# Patient Record
Sex: Female | Born: 1965 | Race: White | Hispanic: No | Marital: Married | Smoking: Never smoker
Health system: Southern US, Community
[De-identification: ages and names within clinical notes are randomized; demographics above are authoritative.]

## PROBLEM LIST (undated history)

## (undated) DIAGNOSIS — N920 Excessive and frequent menstruation with regular cycle: Secondary | ICD-10-CM

---

## 1999-04-01 ENCOUNTER — Inpatient Hospital Stay (HOSPITAL_COMMUNITY): Admission: AD | Admit: 1999-04-01 | Discharge: 1999-04-04 | Payer: Self-pay | Admitting: Obstetrics and Gynecology

## 1999-08-27 ENCOUNTER — Encounter: Admission: RE | Admit: 1999-08-27 | Discharge: 1999-08-27 | Payer: Self-pay | Admitting: Obstetrics and Gynecology

## 1999-08-27 ENCOUNTER — Encounter: Payer: Self-pay | Admitting: Obstetrics and Gynecology

## 2000-07-24 ENCOUNTER — Other Ambulatory Visit: Admission: RE | Admit: 2000-07-24 | Discharge: 2000-07-24 | Payer: Self-pay | Admitting: Obstetrics and Gynecology

## 2001-09-19 ENCOUNTER — Other Ambulatory Visit: Admission: RE | Admit: 2001-09-19 | Discharge: 2001-09-19 | Payer: Self-pay | Admitting: Obstetrics and Gynecology

## 2002-10-16 ENCOUNTER — Other Ambulatory Visit: Admission: RE | Admit: 2002-10-16 | Discharge: 2002-10-16 | Payer: Self-pay | Admitting: Obstetrics and Gynecology

## 2004-01-26 ENCOUNTER — Other Ambulatory Visit: Admission: RE | Admit: 2004-01-26 | Discharge: 2004-01-26 | Payer: Self-pay | Admitting: Obstetrics and Gynecology

## 2004-10-19 ENCOUNTER — Other Ambulatory Visit: Admission: RE | Admit: 2004-10-19 | Discharge: 2004-10-19 | Payer: Self-pay | Admitting: Obstetrics and Gynecology

## 2004-11-25 ENCOUNTER — Ambulatory Visit (HOSPITAL_COMMUNITY): Admission: RE | Admit: 2004-11-25 | Discharge: 2004-11-25 | Payer: Self-pay | Admitting: Obstetrics and Gynecology

## 2005-02-22 ENCOUNTER — Ambulatory Visit (HOSPITAL_COMMUNITY): Admission: RE | Admit: 2005-02-22 | Discharge: 2005-02-22 | Payer: Self-pay | Admitting: Obstetrics and Gynecology

## 2005-05-04 ENCOUNTER — Inpatient Hospital Stay (HOSPITAL_COMMUNITY): Admission: RE | Admit: 2005-05-04 | Discharge: 2005-05-07 | Payer: Self-pay | Admitting: Obstetrics and Gynecology

## 2005-05-12 ENCOUNTER — Encounter: Admission: RE | Admit: 2005-05-12 | Discharge: 2005-06-11 | Payer: Self-pay | Admitting: Obstetrics and Gynecology

## 2005-06-06 ENCOUNTER — Other Ambulatory Visit: Admission: RE | Admit: 2005-06-06 | Discharge: 2005-06-06 | Payer: Self-pay | Admitting: Obstetrics and Gynecology

## 2005-06-12 ENCOUNTER — Encounter: Admission: RE | Admit: 2005-06-12 | Discharge: 2005-06-23 | Payer: Self-pay | Admitting: Obstetrics and Gynecology

## 2010-10-07 ENCOUNTER — Other Ambulatory Visit: Payer: Self-pay | Admitting: Obstetrics and Gynecology

## 2010-10-07 DIAGNOSIS — R928 Other abnormal and inconclusive findings on diagnostic imaging of breast: Secondary | ICD-10-CM

## 2010-10-13 ENCOUNTER — Ambulatory Visit
Admission: RE | Admit: 2010-10-13 | Discharge: 2010-10-13 | Disposition: A | Discharge status: Home / Self Care | Payer: BC Managed Care – PPO | Source: Ambulatory Visit | Attending: Obstetrics and Gynecology | Admitting: Obstetrics and Gynecology

## 2010-10-13 DIAGNOSIS — R928 Other abnormal and inconclusive findings on diagnostic imaging of breast: Secondary | ICD-10-CM

## 2012-06-14 ENCOUNTER — Other Ambulatory Visit (HOSPITAL_COMMUNITY): Payer: Self-pay | Admitting: Obstetrics and Gynecology

## 2012-06-14 DIAGNOSIS — R011 Cardiac murmur, unspecified: Secondary | ICD-10-CM

## 2012-06-19 ENCOUNTER — Other Ambulatory Visit (HOSPITAL_COMMUNITY): Payer: Self-pay | Admitting: Cardiovascular Disease

## 2012-06-19 ENCOUNTER — Ambulatory Visit (HOSPITAL_COMMUNITY): Payer: BC Managed Care – PPO

## 2012-06-19 DIAGNOSIS — R0989 Other specified symptoms and signs involving the circulatory and respiratory systems: Secondary | ICD-10-CM

## 2012-06-20 ENCOUNTER — Ambulatory Visit (HOSPITAL_COMMUNITY): Payer: BC Managed Care – PPO

## 2012-06-25 ENCOUNTER — Ambulatory Visit (HOSPITAL_COMMUNITY)
Admission: RE | Admit: 2012-06-25 | Discharge: 2012-06-25 | Disposition: A | Discharge status: Home / Self Care | Payer: BC Managed Care – PPO | Source: Ambulatory Visit | Attending: Cardiovascular Disease | Admitting: Cardiovascular Disease

## 2012-06-25 DIAGNOSIS — R0989 Other specified symptoms and signs involving the circulatory and respiratory systems: Secondary | ICD-10-CM | POA: Insufficient documentation

## 2012-06-25 NOTE — Progress Notes (Signed)
Carotid Duplex Complete Zelda Reames 

## 2013-07-24 ENCOUNTER — Ambulatory Visit (INDEPENDENT_AMBULATORY_CARE_PROVIDER_SITE_OTHER): Payer: BC Managed Care – PPO | Admitting: Internal Medicine

## 2013-07-24 ENCOUNTER — Encounter: Payer: Self-pay | Admitting: Internal Medicine

## 2013-07-24 VITALS — BP 128/78 | HR 72 | Temp 98.2°F | Resp 18 | Wt 146.0 lb

## 2013-07-24 DIAGNOSIS — Z7189 Other specified counseling: Secondary | ICD-10-CM

## 2013-07-24 DIAGNOSIS — N6019 Diffuse cystic mastopathy of unspecified breast: Secondary | ICD-10-CM

## 2013-07-24 DIAGNOSIS — Z630 Problems in relationship with spouse or partner: Secondary | ICD-10-CM

## 2013-07-24 LAB — COMPREHENSIVE METABOLIC PANEL
ALBUMIN: 4.7 g/dL (ref 3.5–5.2)
ALT: 10 U/L (ref 0–35)
AST: 15 U/L (ref 0–37)
Alkaline Phosphatase: 39 U/L (ref 39–117)
BUN: 11 mg/dL (ref 6–23)
CALCIUM: 9.7 mg/dL (ref 8.4–10.5)
CHLORIDE: 102 meq/L (ref 96–112)
CO2: 27 meq/L (ref 19–32)
Creat: 0.87 mg/dL (ref 0.50–1.10)
GLUCOSE: 88 mg/dL (ref 70–99)
Potassium: 4.4 mEq/L (ref 3.5–5.3)
Sodium: 137 mEq/L (ref 135–145)
Total Bilirubin: 0.9 mg/dL (ref 0.2–1.2)
Total Protein: 7.3 g/dL (ref 6.0–8.3)

## 2013-07-24 LAB — LIPID PANEL
Cholesterol: 190 mg/dL (ref 0–200)
HDL: 69 mg/dL (ref >39)
LDL CALC: 107 mg/dL — AB (ref 0–99)
Total CHOL/HDL Ratio: 2.8 Ratio
Triglycerides: 68 mg/dL (ref <150)
VLDL: 14 mg/dL (ref 0–40)

## 2013-07-24 LAB — CBC WITH DIFFERENTIAL/PLATELET
Basophils Absolute: 0.1 10*3/uL (ref 0.0–0.1)
Basophils Relative: 1 % (ref 0–1)
Eosinophils Absolute: 0.1 10*3/uL (ref 0.0–0.7)
Eosinophils Relative: 1 % (ref 0–5)
HCT: 38 % (ref 36.0–46.0)
Hemoglobin: 13.2 g/dL (ref 12.0–15.0)
LYMPHS ABS: 1.2 10*3/uL (ref 0.7–4.0)
LYMPHS PCT: 22 % (ref 12–46)
MCH: 31.9 pg (ref 26.0–34.0)
MCHC: 34.7 g/dL (ref 30.0–36.0)
MCV: 91.8 fL (ref 78.0–100.0)
MONO ABS: 0.4 10*3/uL (ref 0.1–1.0)
Monocytes Relative: 8 % (ref 3–12)
NEUTROS ABS: 3.7 10*3/uL (ref 1.7–7.7)
Neutrophils Relative %: 68 % (ref 43–77)
Platelets: 273 10*3/uL (ref 150–400)
RBC: 4.14 MIL/uL (ref 3.87–5.11)
RDW: 13.6 % (ref 11.5–15.5)
WBC: 5.4 10*3/uL (ref 4.0–10.5)

## 2013-07-24 LAB — TSH: TSH: 0.935 u[IU]/mL (ref 0.350–4.500)

## 2013-07-24 NOTE — Patient Instructions (Signed)
Give pt number to [facility]  Schedule CPE  To laba todday

## 2013-07-24 NOTE — Progress Notes (Signed)
   Subjective:    Patient ID: Lauren York, female    DOB: 07/11/1965, 48 y.o.   MRN: [id number]  HPI Lauren York is a new pt first visit for primary care  PMH fibrocystic breast disease otherwise healthy.  She has a Mirena IUD    Marital stress now.  Hus[facility] had stem cell [facility] for [facility].  She uses an occasional Xanax.    No Known Allergies History reviewed. No pertinent past medical history. History reviewed. No pertinent past surgical history. History   Social History  . Marital Status: Married    Spouse Name: N/A    Number of Children: N/A  . Years of Education: N/A   Occupational History  . Not on file.   Social History Main Topics  . Smoking status: Never Smoker   . Smokeless tobacco: Never Used  . Alcohol Use: Not on file     Comment: weekly  . Drug Use: No  . Sexual Activity: Yes    Birth Control/ Protection: IUD   Other Topics Concern  . Not on file   Social History Narrative  . No narrative on file   Family History  Problem Relation Age of Onset  . Atrial fibrillation Mother   . Heart disease Father   . Cancer Father     prostate   Patient Active Problem List   Diagnosis Date Noted  . Fibrocystic breast 07/24/2013   No current outpatient prescriptions on file prior to visit.   No current facility-administered medications on file prior to visit.      Review of Systems See HPI    Objective:   Physical Exam Physical Exam  Nursing note and vitals reviewed.  Pt.  Teary during interview Constitutional: She is oriented to person, place, and time. She appears well-developed and well-nourished.  HENT:  Head: Normocephalic and atraumatic.  Cardiovascular: Normal rate and regular rhythm. Exam reveals no gallop and no friction rub.  No murmur heard.  Pulmonary/Chest: Breath sounds normal. She has no wheezes. She has no rales.  Neurological: She is alert and oriented to person, place, and time.  Skin: Skin is warm and dry.  Psychiatric: She  has a normal mood and affect. Her behavior is normal.        Assessment & Plan:  Fibrocystic breast :  [facility]  Marital strife    Will check all labs to day  Schedule CPE  I spent 45 mints with this pt

## 2013-07-25 LAB — VITAMIN D 25 HYDROXY (VIT D DEFICIENCY, FRACTURES): Vit D, 25-Hydroxy: 41 ng/mL (ref 30–89)

## 2013-07-29 ENCOUNTER — Encounter: Payer: Self-pay | Admitting: *Deleted

## 2014-03-10 ENCOUNTER — Encounter: Payer: Self-pay | Admitting: Internal Medicine

## 2014-06-05 ENCOUNTER — Other Ambulatory Visit: Payer: Self-pay | Admitting: Hematology & Oncology

## 2014-06-05 DIAGNOSIS — J208 Acute bronchitis due to other specified organisms: Secondary | ICD-10-CM

## 2014-06-05 MED ORDER — OSELTAMIVIR PHOSPHATE 75 MG PO CAPS: 75.0000 mg | ORAL_CAPSULE | Freq: Two times a day (BID) | ORAL | Status: DC

## 2014-11-27 ENCOUNTER — Encounter: Payer: Self-pay | Admitting: *Deleted

## 2014-12-30 ENCOUNTER — Encounter: Payer: Self-pay | Admitting: Cardiovascular Disease

## 2015-01-14 ENCOUNTER — Encounter (HOSPITAL_BASED_OUTPATIENT_CLINIC_OR_DEPARTMENT_OTHER): Payer: Self-pay | Admitting: *Deleted

## 2015-01-14 NOTE — Progress Notes (Signed)
NPO AFTER MN.  ARRIVE AT 0600.  NEEDS HG AND URINE PREG.  

## 2015-01-17 NOTE — H&P (Signed)
Lauren York is an 49 y.o. female with heavy menstrual bleeding despite Nexplanon.  Patient declines continued hormonal treatment.   Pertinent Gynecological History: Menses: regular every month without intermenstrual spotting Bleeding: dysfunctional uterine bleeding Contraception: Nexplanon DES exposure: unknown Blood transfusions: none Sexually transmitted diseases: no past history Previous GYN Procedures: C/S x 2  Last mammogram: normal Date: 2016 Last pap: normal Date: 2016 OB History: G2, P2  Menstrual History: Menarche age: n/a Patient's last menstrual period was 01/03/2015 (approximate).    Past Medical History  Diagnosis Date  . Heavy menstrual bleeding     Past Surgical History  Procedure Laterality Date  . Cesarean section  2000  &  2006    Family History  Problem Relation Age of Onset  . Atrial fibrillation Mother   . Heart disease Father   . Cancer Father     prostate  . Heart disease Maternal Grandmother     Social History:  reports that she has never smoked. She has never used smokeless tobacco. She reports that she does not use illicit drugs. Her alcohol history is not on file.  Allergies: No Known Allergies  No prescriptions prior to admission    ROS  Height  (1.753 m), weight 140 lb (63.504 kg), last menstrual period 01/03/2015. Physical Exam  Constitutional: She is oriented to person, place, and time. She appears well-developed and well-nourished.  GI: Soft. There is no rebound and no guarding.  Neurological: She is alert and oriented to person, place, and time.  Skin: Skin is warm and dry.  Psychiatric: She has a normal mood and affect. Her behavior is normal.    No results found for this or any previous visit (from the past 24 hour(s)).  No results found.  Assessment/Plan: 49yo G2P2 with heavy menstrual bleeding -Laparoscopic BTL, H/S D&C, Novasure, Nexplanon removal  Lauren York 01/17/2015, 9:57 PM

## 2015-01-20 ENCOUNTER — Encounter (HOSPITAL_BASED_OUTPATIENT_CLINIC_OR_DEPARTMENT_OTHER): Payer: Self-pay | Admitting: *Deleted

## 2015-01-20 ENCOUNTER — Ambulatory Visit (HOSPITAL_BASED_OUTPATIENT_CLINIC_OR_DEPARTMENT_OTHER): Payer: BLUE CROSS/BLUE SHIELD | Admitting: Certified Registered"

## 2015-01-20 ENCOUNTER — Ambulatory Visit (HOSPITAL_BASED_OUTPATIENT_CLINIC_OR_DEPARTMENT_OTHER)
Admission: RE | Admit: 2015-01-20 | Discharge: 2015-01-20 | Disposition: A | Discharge status: Home / Self Care | Payer: BLUE CROSS/BLUE SHIELD | Source: Ambulatory Visit | Attending: Obstetrics & Gynecology | Admitting: Obstetrics & Gynecology

## 2015-01-20 ENCOUNTER — Encounter (HOSPITAL_BASED_OUTPATIENT_CLINIC_OR_DEPARTMENT_OTHER)
Admission: RE | Disposition: A | Discharge status: Home / Self Care | Payer: Self-pay | Source: Ambulatory Visit | Attending: Obstetrics & Gynecology

## 2015-01-20 DIAGNOSIS — N920 Excessive and frequent menstruation with regular cycle: Secondary | ICD-10-CM | POA: Diagnosis not present

## 2015-01-20 DIAGNOSIS — Z302 Encounter for sterilization: Secondary | ICD-10-CM | POA: Diagnosis not present

## 2015-01-20 DIAGNOSIS — Z3049 Encounter for surveillance of other contraceptives: Secondary | ICD-10-CM | POA: Diagnosis not present

## 2015-01-20 DIAGNOSIS — N840 Polyp of corpus uteri: Secondary | ICD-10-CM | POA: Diagnosis not present

## 2015-01-20 DIAGNOSIS — N6019 Diffuse cystic mastopathy of unspecified breast: Secondary | ICD-10-CM

## 2015-01-20 HISTORY — PX: LAPAROSCOPIC TUBAL LIGATION: SHX1937

## 2015-01-20 HISTORY — DX: Excessive and frequent menstruation with regular cycle: N92.0

## 2015-01-20 HISTORY — PX: REMOVAL OF DRUG DELIVERY IMPLANT: SHX6585

## 2015-01-20 HISTORY — PX: DILITATION & CURRETTAGE/HYSTROSCOPY WITH NOVASURE ABLATION: SHX5568

## 2015-01-20 LAB — POCT PREGNANCY, URINE: PREG TEST UR: NEGATIVE

## 2015-01-20 LAB — POCT HEMOGLOBIN-HEMACUE: HEMOGLOBIN: 12.8 g/dL (ref 12.0–15.0)

## 2015-01-20 SURGERY — LIGATION, FALLOPIAN TUBE, LAPAROSCOPIC
Anesthesia: General | Site: Vagina

## 2015-01-20 MED ORDER — ROCURONIUM BROMIDE 100 MG/10ML IV SOLN
INTRAVENOUS | Status: DC | PRN
  Administered 2015-01-20: 40 mg via INTRAVENOUS

## 2015-01-20 MED ORDER — ACETAMINOPHEN 10 MG/ML IV SOLN
INTRAVENOUS | Status: DC | PRN
  Administered 2015-01-20: 1000 mg via INTRAVENOUS

## 2015-01-20 MED ORDER — GLYCOPYRROLATE 0.2 MG/ML IJ SOLN
INTRAMUSCULAR | Status: DC | PRN
  Administered 2015-01-20: 0.6 mg via INTRAVENOUS

## 2015-01-20 MED ORDER — LACTATED RINGERS IV SOLN
INTRAVENOUS | Status: DC
  Administered 2015-01-20 (×2): via INTRAVENOUS
  Filled 2015-01-20: qty 1000

## 2015-01-20 MED ORDER — MIDAZOLAM HCL 5 MG/5ML IJ SOLN
INTRAMUSCULAR | Status: DC | PRN
  Administered 2015-01-20 (×2): 1 mg via INTRAVENOUS

## 2015-01-20 MED ORDER — FENTANYL CITRATE (PF) 100 MCG/2ML IJ SOLN
INTRAMUSCULAR | Status: AC
  Filled 2015-01-20: qty 6

## 2015-01-20 MED ORDER — ONDANSETRON HCL 4 MG/2ML IJ SOLN
INTRAMUSCULAR | Status: DC | PRN
Start: 2015-01-20 — End: 2015-01-20
  Administered 2015-01-20: 4 mg via INTRAVENOUS

## 2015-01-20 MED ORDER — PROPOFOL 10 MG/ML IV BOLUS
INTRAVENOUS | Status: DC | PRN
  Administered 2015-01-20: 200 mg via INTRAVENOUS

## 2015-01-20 MED ORDER — LIDOCAINE HCL (CARDIAC) 20 MG/ML IV SOLN
INTRAVENOUS | Status: DC | PRN
  Administered 2015-01-20: 60 mg via INTRAVENOUS

## 2015-01-20 MED ORDER — SODIUM CHLORIDE 0.9 % IR SOLN
Status: DC | PRN
  Administered 2015-01-20: 3000 mL

## 2015-01-20 MED ORDER — DEXAMETHASONE SODIUM PHOSPHATE 4 MG/ML IJ SOLN
INTRAMUSCULAR | Status: DC | PRN
  Administered 2015-01-20: 10 mg via INTRAVENOUS

## 2015-01-20 MED ORDER — CEFAZOLIN SODIUM-DEXTROSE 2-3 GM-% IV SOLR
INTRAVENOUS | Status: AC
  Filled 2015-01-20: qty 50

## 2015-01-20 MED ORDER — CEFAZOLIN SODIUM-DEXTROSE 2-3 GM-% IV SOLR
2.0000 g | INTRAVENOUS | Status: AC
  Administered 2015-01-20: 2 g via INTRAVENOUS
  Filled 2015-01-20: qty 50

## 2015-01-20 MED ORDER — IBUPROFEN 800 MG PO TABS
800.0000 mg | ORAL_TABLET | Freq: Four times a day (QID) | ORAL | Status: DC | PRN
Start: 2015-01-20 — End: 2020-09-08

## 2015-01-20 MED ORDER — LACTATED RINGERS IV SOLN
INTRAVENOUS | Status: DC
  Filled 2015-01-20: qty 1000

## 2015-01-20 MED ORDER — OXYCODONE HCL 5 MG PO TABS
5.0000 mg | ORAL_TABLET | Freq: Once | ORAL | Status: AC
  Administered 2015-01-20: 5 mg via ORAL
  Filled 2015-01-20: qty 1

## 2015-01-20 MED ORDER — OXYCODONE HCL 5 MG PO TABS
ORAL_TABLET | ORAL | Status: AC
  Filled 2015-01-20: qty 1

## 2015-01-20 MED ORDER — PROMETHAZINE HCL 25 MG/ML IJ SOLN
6.2500 mg | INTRAMUSCULAR | Status: DC | PRN
  Filled 2015-01-20: qty 1

## 2015-01-20 MED ORDER — FENTANYL CITRATE (PF) 100 MCG/2ML IJ SOLN
INTRAMUSCULAR | Status: DC | PRN
  Administered 2015-01-20: 25 ug via INTRAVENOUS
  Administered 2015-01-20: 50 ug via INTRAVENOUS
  Administered 2015-01-20 (×3): 25 ug via INTRAVENOUS
  Administered 2015-01-20: 50 ug via INTRAVENOUS
  Administered 2015-01-20 (×4): 25 ug via INTRAVENOUS

## 2015-01-20 MED ORDER — OXYCODONE-ACETAMINOPHEN 5-325 MG PO TABS: 1.0000 | ORAL_TABLET | ORAL | Status: DC | PRN

## 2015-01-20 MED ORDER — NEOSTIGMINE METHYLSULFATE 10 MG/10ML IV SOLN
INTRAVENOUS | Status: DC | PRN
  Administered 2015-01-20: 4 mg via INTRAVENOUS

## 2015-01-20 MED ORDER — FENTANYL CITRATE (PF) 100 MCG/2ML IJ SOLN
INTRAMUSCULAR | Status: AC
  Filled 2015-01-20: qty 2

## 2015-01-20 MED ORDER — MEPERIDINE HCL 25 MG/ML IJ SOLN
6.2500 mg | INTRAMUSCULAR | Status: DC | PRN
  Filled 2015-01-20: qty 1

## 2015-01-20 MED ORDER — LACTATED RINGERS IV SOLN
INTRAVENOUS | Status: DC
  Administered 2015-01-20: 06:00:00 via INTRAVENOUS
  Filled 2015-01-20: qty 1000

## 2015-01-20 MED ORDER — BUPIVACAINE HCL (PF) 0.25 % IJ SOLN
INTRAMUSCULAR | Status: DC | PRN
  Administered 2015-01-20: 5 mL

## 2015-01-20 MED ORDER — MIDAZOLAM HCL 2 MG/2ML IJ SOLN
INTRAMUSCULAR | Status: AC
  Filled 2015-01-20: qty 2

## 2015-01-20 MED ORDER — KETOROLAC TROMETHAMINE 30 MG/ML IJ SOLN
INTRAMUSCULAR | Status: DC | PRN
  Administered 2015-01-20: 30 mg via INTRAVENOUS

## 2015-01-20 MED ORDER — FENTANYL CITRATE (PF) 100 MCG/2ML IJ SOLN
25.0000 ug | INTRAMUSCULAR | Status: DC | PRN
  Administered 2015-01-20 (×3): 25 ug via INTRAVENOUS
  Filled 2015-01-20: qty 1

## 2015-01-20 SURGICAL SUPPLY — 66 items
ABLATOR ENDOMETRIAL BIPOLAR (ABLATOR) ×1 IMPLANT
APL SKNCLS STERI-STRIP NONHPOA (GAUZE/BANDAGES/DRESSINGS)
APPLICATOR COTTON TIP 6IN STRL (MISCELLANEOUS) ×4 IMPLANT
BAG SPEC RTRVL LRG 6X4 10 (ENDOMECHANICALS)
BANDAGE ADH SHEER 1  50/CT (GAUZE/BANDAGES/DRESSINGS) ×1 IMPLANT
BENZOIN TINCTURE PRP APPL 2/3 (GAUZE/BANDAGES/DRESSINGS) IMPLANT
BLADE SURG 11 STRL SS (BLADE) ×4 IMPLANT
BLADE SURG 15 STRL LF DISP TIS (BLADE) IMPLANT
BLADE SURG 15 STRL SS (BLADE) ×4
CANISTER SUCTION 2500CC (MISCELLANEOUS) ×4 IMPLANT
CATH ROBINSON RED A/P 16FR (CATHETERS) ×4 IMPLANT
CHLORAPREP W/TINT 26ML (MISCELLANEOUS) ×4 IMPLANT
CLIP FILSHIE TUBAL LIGA STRL (Clip) ×3 IMPLANT
COVER BACK TABLE 60X90IN (DRAPES) ×8 IMPLANT
COVER MAYO STAND STRL (DRAPES) ×4 IMPLANT
DRAPE LG THREE QUARTER DISP (DRAPES) ×4 IMPLANT
DRAPE UNDERBUTTOCKS STRL (DRAPE) ×4 IMPLANT
DRSG TEGADERM 2-3/8X2-3/4 SM (GAUZE/BANDAGES/DRESSINGS) ×1 IMPLANT
DRSG TEGADERM 4X4.75 (GAUZE/BANDAGES/DRESSINGS) ×4 IMPLANT
DRSG TELFA 3X8 NADH (GAUZE/BANDAGES/DRESSINGS) ×4 IMPLANT
ELECT REM PT RETURN 9FT ADLT (ELECTROSURGICAL) ×4
ELECTRODE REM PT RTRN 9FT ADLT (ELECTROSURGICAL) ×3 IMPLANT
GLOVE BIO SURGEON STRL SZ 6 (GLOVE) ×8 IMPLANT
GLOVE BIOGEL PI IND STRL 6 (GLOVE) ×6 IMPLANT
GLOVE BIOGEL PI IND STRL 7.5 (GLOVE) IMPLANT
GLOVE BIOGEL PI INDICATOR 6 (GLOVE) ×2
GLOVE BIOGEL PI INDICATOR 7.5 (GLOVE) ×3
GLOVE SURG SS PI 6.0 STRL IVOR (GLOVE) ×8 IMPLANT
GOWN STRL REUS W/ TWL LRG LVL3 (GOWN DISPOSABLE) ×9 IMPLANT
GOWN STRL REUS W/TWL LRG LVL3 (GOWN DISPOSABLE) ×4
LEGGING LITHOTOMY PAIR STRL (DRAPES) ×4 IMPLANT
LIQUID BAND (GAUZE/BANDAGES/DRESSINGS) IMPLANT
LOOP ANGLED CUTTING 22FR (CUTTING LOOP) IMPLANT
MANIFOLD NEPTUNE II (INSTRUMENTS) IMPLANT
NDL HYPO 25X1 1.5 SAFETY (NEEDLE) ×3 IMPLANT
NDL SPNL 22GX3.5 QUINCKE BK (NEEDLE) IMPLANT
NEEDLE HYPO 25X1 1.5 SAFETY (NEEDLE) ×4 IMPLANT
NEEDLE INSUFFLATION 120MM (ENDOMECHANICALS) ×4 IMPLANT
NEEDLE SPNL 22GX3.5 QUINCKE BK (NEEDLE) IMPLANT
NS IRRIG 500ML POUR BTL (IV SOLUTION) ×4 IMPLANT
PACK BASIN DAY SURGERY FS (CUSTOM PROCEDURE TRAY) ×4 IMPLANT
PAD DRESSING TELFA 3X8 NADH (GAUZE/BANDAGES/DRESSINGS) ×3 IMPLANT
PAD OB MATERNITY 4.3X12.25 (PERSONAL CARE ITEMS) ×4 IMPLANT
PADDING ION DISPOSABLE (MISCELLANEOUS) ×4 IMPLANT
PENCIL BUTTON HOLSTER BLD 10FT (ELECTRODE) IMPLANT
POUCH SPECIMEN RETRIEVAL 10MM (ENDOMECHANICALS) IMPLANT
SCISSORS LAP 5X35 DISP (ENDOMECHANICALS) IMPLANT
SEALER TISSUE G2 CVD JAW 35 (ENDOMECHANICALS) IMPLANT
SEALER TISSUE G2 CVD JAW 45CM (ENDOMECHANICALS) IMPLANT
SET IRRIG TUBING LAPAROSCOPIC (IRRIGATION / IRRIGATOR) IMPLANT
SOLUTION ANTI FOG 6CC (MISCELLANEOUS) ×4 IMPLANT
SPONGE GAUZE 2X2 8PLY STRL LF (GAUZE/BANDAGES/DRESSINGS) ×1 IMPLANT
SPONGE LAP 4X18 X RAY DECT (DISPOSABLE) IMPLANT
STRIP CLOSURE SKIN 1/2X4 (GAUZE/BANDAGES/DRESSINGS) IMPLANT
STRIP CLOSURE SKIN 1/4X4 (GAUZE/BANDAGES/DRESSINGS) IMPLANT
SUT MNCRL AB 3-0 PS2 18 (SUTURE) ×4 IMPLANT
SUT VICRYL 0 UR6 27IN ABS (SUTURE) ×4 IMPLANT
SYR CONTROL 10ML LL (SYRINGE) ×4 IMPLANT
TOWEL OR 17X24 6PK STRL BLUE (TOWEL DISPOSABLE) ×8 IMPLANT
TRAY DSU PREP LF (CUSTOM PROCEDURE TRAY) ×4 IMPLANT
TROCAR BLADELESS OPT 5 100 (ENDOMECHANICALS) ×1 IMPLANT
TROCAR XCEL NON-BLD 11X100MML (ENDOMECHANICALS) ×4 IMPLANT
TROCAR XCEL NON-BLD 5MMX100MML (ENDOMECHANICALS) IMPLANT
TUBING INSUFFLATION 10FT LAP (TUBING) ×4 IMPLANT
WARMER LAPAROSCOPE (MISCELLANEOUS) ×4 IMPLANT
WATER STERILE IRR 500ML POUR (IV SOLUTION) ×4 IMPLANT

## 2015-01-20 NOTE — Progress Notes (Signed)
No change to H&P.  Arlin Sass, DO 

## 2015-01-20 NOTE — Discharge Instructions (Signed)
Call MD for T>100.4, heavy vaginal bleeding, severe abdominal pain, intractable nausea and/or vomiting or respiratory distress.  Call office to schedule postop appointment in 2 weeks.  No driving while taking narcotics.  Pelvic rest x 4 weeks.    HOME CARE INSTRUCTIONS - LAPAROSCOPY  Wound Care: The bandaids or dressing which are placed over the skin openings may be removed the day after surgery. The incision should be kept clean and dry. The stitches do not need to be removed. Should the incision become sore, red, and swollen after the first week, check with your doctor.  Personal Hygiene: Shower the day after your procedure. Always wipe from front to back after elimination.   Activity: Do not drive or operate any equipment today. The effects of the anesthesia are still present and drowsiness may result. Rest today, not necessarily flat bed rest, just take it easy. You may resume your normal activity in one to three days or as instructed by your physician.  Sexual Activity: You resume sexual activity as indicated by your physician_________. If your laparoscopy was for a sterilization ( tubes tied ), continue current method of birth control until after your next period or ask for specific instructions from your doctor.  Diet: Eat a light diet as desired this evening. You may resume a regular diet tomorrow.  Return to Work: Two to three days or as indicated by your doctor.  Expectations After Surgery: Your surgery will cause vaginal drainage or spotting which may continue for 2-3 days. Mild abdominal discomfort or tenderness is not unusual and some shoulder pain may also be noted which can be relieved by lying flat in pain.  Call Your Doctor If these Occur:  Persistent or heavy bleeding at incision site       Redness or swelling around incision       Elevation of temperature greater than 100 degrees F  Call for follow-up appointment _____________.   Post Anesthesia Home Care  Instructions  Activity: Get plenty of rest for the remainder of the day. A responsible adult should stay with you for 24 hours following the procedure.  For the next 24 hours, DO NOT: -Drive a car -Advertising copywriter -Drink alcoholic beverages -Take any medication unless instructed by your physician -Make any legal decisions or sign important papers.  Meals: Start with liquid foods such as gelatin or soup. Progress to regular foods as tolerated. Avoid greasy, spicy, heavy foods. If nausea and/or vomiting occur, drink only clear liquids until the nausea and/or vomiting subsides. Call your physician if vomiting continues.  Special Instructions/Symptoms: Your throat may feel dry or sore from the anesthesia or the breathing tube placed in your throat during surgery. If this causes discomfort, gargle with warm salt water. The discomfort should disappear within 24 hours.  If you had a scopolamine patch placed behind your ear for the management of post- operative nausea and/or vomiting:  1. The medication in the patch is effective for 72 hours, after which it should be removed.  Wrap patch in a tissue and discard in the trash. Wash hands thoroughly with soap and water. 2. You may remove the patch earlier than 72 hours if you experience unpleasant side effects which may include dry mouth, dizziness or visual disturbances. 3. Avoid touching the patch. Wash your hands with soap and water after contact with the patch.

## 2015-01-20 NOTE — Anesthesia Procedure Notes (Signed)
Procedure Name: Intubation Date/Time: 01/20/2015 7:37 AM Performed by: Jessica Priest Pre-anesthesia Checklist: Patient identified, Emergency Drugs available, Suction available and Patient being monitored Patient Re-evaluated:Patient Re-evaluated prior to inductionOxygen Delivery Method: Circle System Utilized Preoxygenation: Pre-oxygenation with 100% oxygen Intubation Type: IV induction Ventilation: Mask ventilation without difficulty Laryngoscope Size: Mac and 3 Grade View: Grade I Tube type: Oral Tube size: 7.0 mm Number of attempts: 1 Airway Equipment and Method: Stylet and Oral airway Placement Confirmation: ETT inserted through vocal cords under direct vision,  positive ETCO2 and breath sounds checked- equal and bilateral Secured at: 23 cm Tube secured with: Tape Dental Injury: Teeth and Oropharynx as per pre-operative assessment

## 2015-01-20 NOTE — Anesthesia Preprocedure Evaluation (Addendum)
Anesthesia Evaluation  Patient identified by MRN, date of birth, ID band Patient awake    Reviewed: Allergy & Precautions, NPO status , Patient's Chart, lab work & pertinent test results  Airway Mallampati: II  TM Distance: >3 FB Neck ROM: Full    Dental no notable dental hx.    Pulmonary neg pulmonary ROS,    Pulmonary exam normal breath sounds clear to auscultation       Cardiovascular negative cardio ROS Normal cardiovascular exam Rhythm:Regular Rate:Normal     Neuro/Psych negative neurological ROS  negative psych ROS   GI/Hepatic negative GI ROS, Neg liver ROS,   Endo/Other  negative endocrine ROS  Renal/GU negative Renal ROS  negative genitourinary   Musculoskeletal negative musculoskeletal ROS (+)   Abdominal   Peds negative pediatric ROS (+)  Hematology negative hematology ROS (+)   Anesthesia Other Findings   Reproductive/Obstetrics negative OB ROS                             Anesthesia Physical Anesthesia Plan  ASA: I  Anesthesia Plan: General   Post-op Pain Management:    Induction: Intravenous  Airway Management Planned: Oral ETT  Additional Equipment:   Intra-op Plan:   Post-operative Plan: Extubation in OR  Informed Consent: I have reviewed the patients History and Physical, chart, labs and discussed the procedure including the risks, benefits and alternatives for the proposed anesthesia with the patient or authorized representative who has indicated his/her understanding and acceptance.   Dental advisory given  Plan Discussed with: CRNA  Anesthesia Plan Comments:         Anesthesia Quick Evaluation  

## 2015-01-20 NOTE — Op Note (Signed)
Luvina B Aveni 01/20/2015  PREOPERATIVE DIAGNOSIS:  Heavy menstrual bleeding, desires sterility  POSTOPERATIVE DIAGNOSIS:  SAA  PROCEDURE:  Laparoscopic BTL, hysteroscopy, D&C, Novasure, removal of Nexplanon  ANESTHESIA:  General endotracheal  COMPLICATIONS:  None immediate.  PATHOLOGY:  Endometrial curettings  ESTIMATED BLOOD LOSS:  Less than 20 ml.  INDICATIONS: 49 y.o.  with heavy menstrual bleeding and desire for permanent sterilization.  Declines continued hormonal tx.    FINDINGS:  Normal retroflexed uterus, normal fallopian tubes and ovaries.  Normal appendix.  Normal internal uterine anatomy; thin endometrium.  TECHNIQUE:  The patient was taken to the operating room where general anesthesia was obtained without difficulty.   The left upper, inner arm was prepped with betadine.  A 3mm skin incision was made at the distal endo of the Nexplanon.  Nexplanon was easily removed and dressing was placed.    She was then placed in the dorsal lithotomy position and prepared and draped in sterile fashion.  After an adequate timeout was performed, the bladder was drained of 125cc clear urine.   A bivalved speculum was then placed in the patient's vagina, and the anterior lip of cervix grasped with the single-tooth tenaculum.  The hulka clip was advanced into the uterus.  The speculum was removed from the vagina.  Attention was then turned to the patient's abdomen where a 10-mm skin incision was made on the umbilical fold.  The Veress needle was carefully introduced into the peritoneal cavity through the abdominal wall.  Intraperitoneal placement was confirmed by drop in intraabdominal pressure with insufflation of carbon dioxide gas.  Adequate pneumoperitoneum was obtained, and the 10/11 XL trocar was then advanced without difficulty into the abdomen where intraabdominal placement was confirmed by the operative laparoscope.  Because of the sharp retroflexion of the uterus, uterine manipulation  alone did not allow access to the fallopian tubes so a second port was needed.  After infiltration with .25% lidocaine, a 5mm suprapubic incision was made.  The 5mm trocar was advanced under direct visualization without difficulty.  The right fallopian tube was elevated and clamped with Filshie clip approximately 3 cm from the cornual region.  The diameter of the tube was greater than average and so a second clip was applied to incorporate the entire tubal diameter.  Hemostasis was noted.  The left fallopian tube was then elevated and a Filshie clip was applied approximately 3 cm from the cornual region.  Hemostasis was noted.  All instruments were removed from the abdomen.  Infraumbilical port fascial incision was closed with 3-0 Vicryl in figure of eight stitch.  Both skin incisions were closed with 3-0 Monocryl in a subcuticular fashion.      The uterine manipulator and the tenaculum were removed from the vagina without complications.   The uteus was sounded to 10 cm and dilated manually with metal dilators to accommodate the 5.5 mm diagnostic hysteroscope.  Once the cervix was dilated, the hysteroscope was inserted under direct visualization using saline as a suspension medium.  The uterine cavity was carefully examined, both ostia were recognized.   After further careful visualization of the uterine cavity, the hysteroscope was removed under direct visualization; measuring cervical length of 4.5 cm on removal of the scope.  A sharp curettage was then performed to obtain a small amount of endometrial curettings.  The Novasure was opened and calibrated according to the previously measured dimensions.  The Novasure was advanced through the cervix and opened; cavity width 3 cm .  The test  cycle was performed and then the ablation.  The cycle lasted 90 seconds at 91 [address].  The Novasure was removed and then the tenaculum was removed from the anterior lip of the cervix and the vaginal speculum was removed after noting  good hemostasis.  The patient tolerated the procedure well and was taken to the recovery area awake, extubated and in stable condition.   The patient tolerated the procedure well.  Sponge, lap, and needle counts were correct times two.  The patient was then taken to the recovery room awake, extubated and in stable  in stable condition.

## 2015-01-20 NOTE — Anesthesia Postprocedure Evaluation (Signed)
  Anesthesia Post-op Note  Patient: Lauren York  Procedure(s) Performed: Procedure(s) (LRB): LAPAROSCOPIC TUBAL LIGATION WITH FISHIE CLIPS (Bilateral) DILATATION & CURETTAGE/HYSTEROSCOPY WITH NOVASURE ABLATION (N/A) REMOVAL OF DRUG DELIVERY IMPLANT (NEXPLANON REMOVAL FROM LEFT ARM) (Left)  Patient Location: PACU  Anesthesia Type: General  Level of Consciousness: awake and alert   Airway and Oxygen Therapy: Patient Spontanous Breathing  Post-op Pain: mild  Post-op Assessment: Post-op Vital signs reviewed, Patient's Cardiovascular Status Stable, Respiratory Function Stable, Patent Airway and No signs of Nausea or vomiting  Last Vitals:  Filed Vitals:   01/20/15 0945  BP: 115/80  Pulse: 53  Temp:   Resp: 12    Post-op Vital Signs: stable   Complications: No apparent anesthesia complications

## 2015-01-20 NOTE — Transfer of Care (Signed)
Immediate Anesthesia Transfer of Care Note  Patient: Lauren York  Procedure(s) Performed: Procedure(s) (LRB): LAPAROSCOPIC TUBAL LIGATION WITH FISHIE CLIPS (Bilateral) DILATATION & CURETTAGE/HYSTEROSCOPY WITH NOVASURE ABLATION (N/A) REMOVAL OF DRUG DELIVERY IMPLANT (NEXPLANON REMOVAL FROM LEFT ARM) (Left)  Patient Location: PACU  Anesthesia Type: General  Level of Consciousness: awake, sedated, patient cooperative and responds to stimulation  Airway & Oxygen Therapy: Patient Spontanous Breathing and Patient connected to face mask oxygen  Post-op Assessment: Report given to PACU RN, Post -op Vital signs reviewed and stable and Patient moving all extremities  Post vital signs: Reviewed and stable  Complications: No apparent anesthesia complications

## 2015-01-21 ENCOUNTER — Encounter (HOSPITAL_BASED_OUTPATIENT_CLINIC_OR_DEPARTMENT_OTHER): Payer: Self-pay | Admitting: Obstetrics & Gynecology

## 2016-03-14 ENCOUNTER — Other Ambulatory Visit: Payer: Self-pay | Admitting: Obstetrics & Gynecology

## 2016-03-14 DIAGNOSIS — R928 Other abnormal and inconclusive findings on diagnostic imaging of breast: Secondary | ICD-10-CM

## 2016-03-17 ENCOUNTER — Ambulatory Visit (INDEPENDENT_AMBULATORY_CARE_PROVIDER_SITE_OTHER): Payer: BLUE CROSS/BLUE SHIELD | Admitting: Physician Assistant

## 2016-03-17 DIAGNOSIS — Z23 Encounter for immunization: Secondary | ICD-10-CM

## 2016-03-22 ENCOUNTER — Ambulatory Visit
Admission: RE | Admit: 2016-03-22 | Discharge: 2016-03-22 | Disposition: A | Discharge status: Home / Self Care | Payer: BLUE CROSS/BLUE SHIELD | Source: Ambulatory Visit | Attending: Obstetrics & Gynecology | Admitting: Obstetrics & Gynecology

## 2016-03-22 DIAGNOSIS — R928 Other abnormal and inconclusive findings on diagnostic imaging of breast: Secondary | ICD-10-CM

## 2018-01-10 IMAGING — MG 2D DIGITAL DIAGNOSTIC UNILATERAL LEFT MAMMOGRAM WITH CAD AND ADJ
6 series · 6 of 14 positions shown · non-contrast
Comparison: 03/10/2016

CLINICAL DATA: Left breast mass detected on recent screening
[facility]

EXAM:
2D DIGITAL DIAGNOSTIC LEFT MAMMOGRAM WITH CAD AND ADJUNCT TOMO
ULTRASOUND LEFT BREAST

[L CC synth-2D]
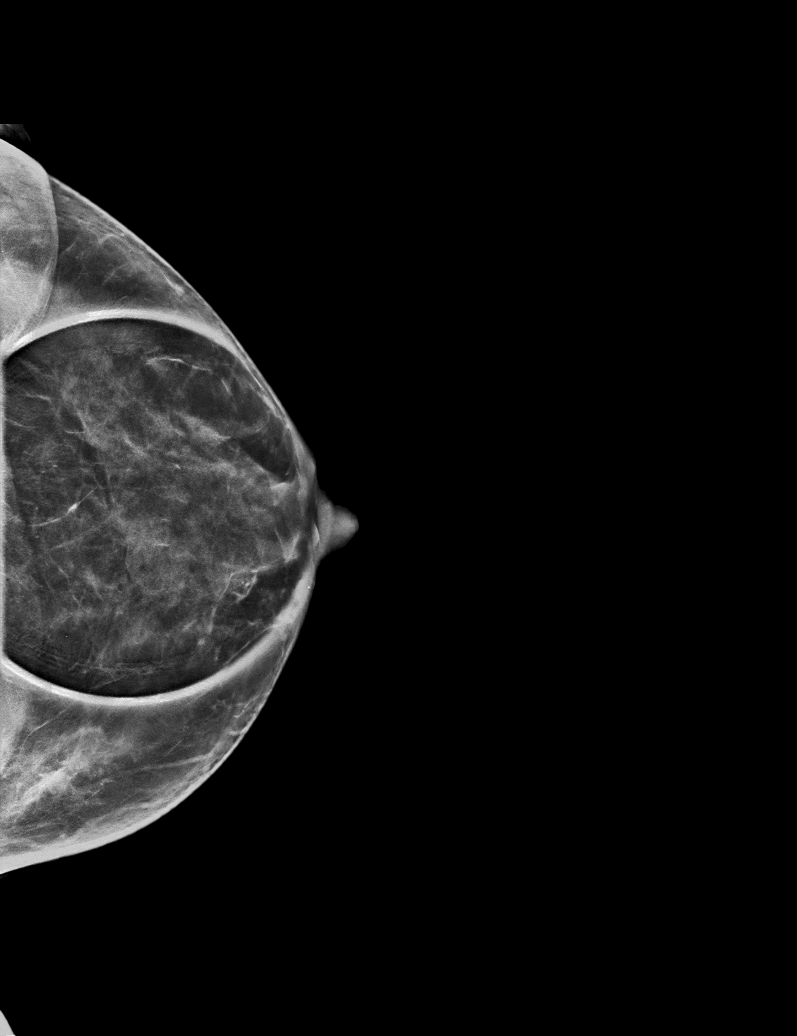

[L MLO]
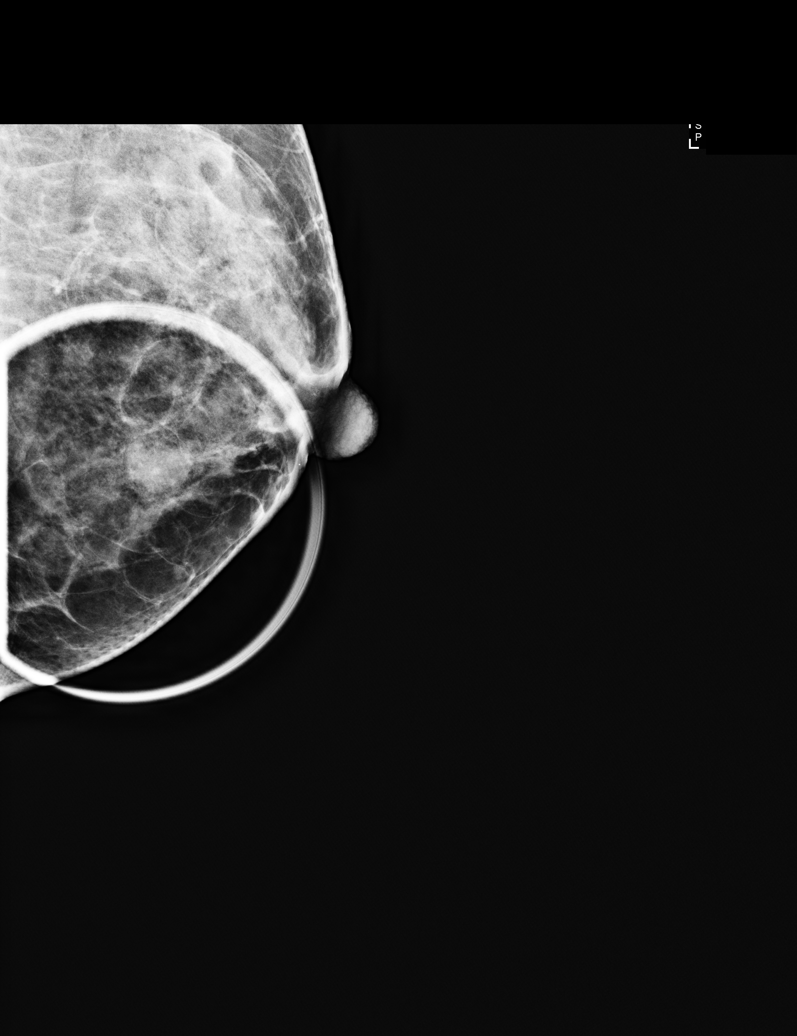

[L CC]
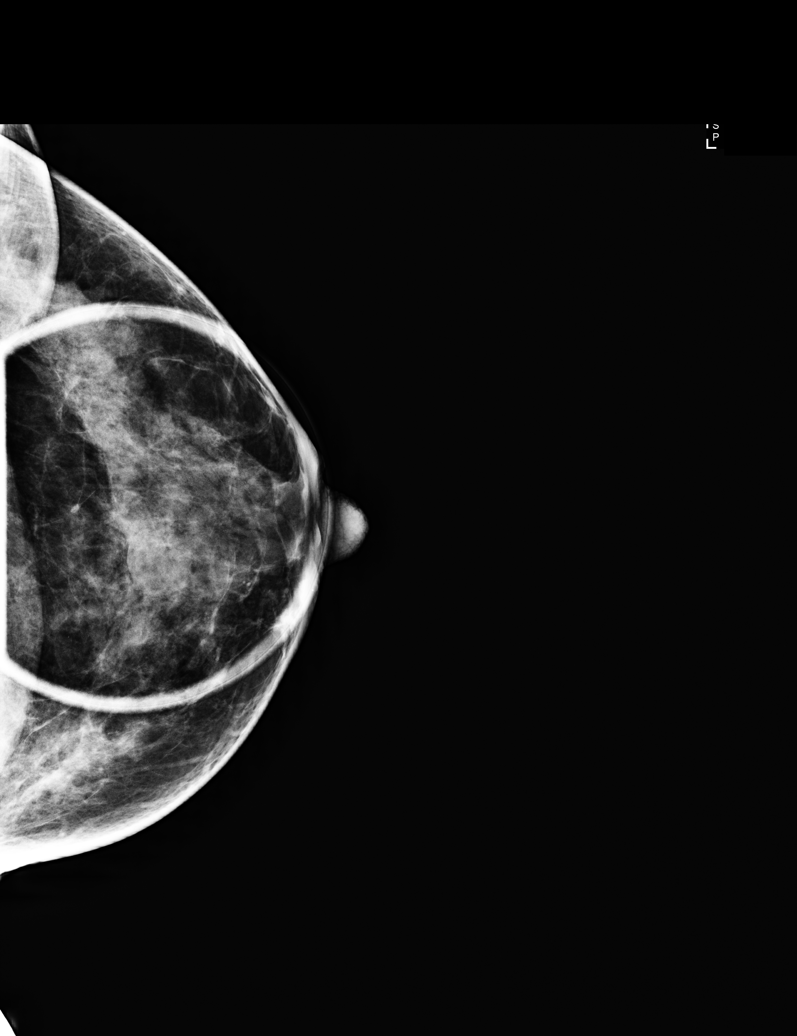

[L MLO synth-2D]
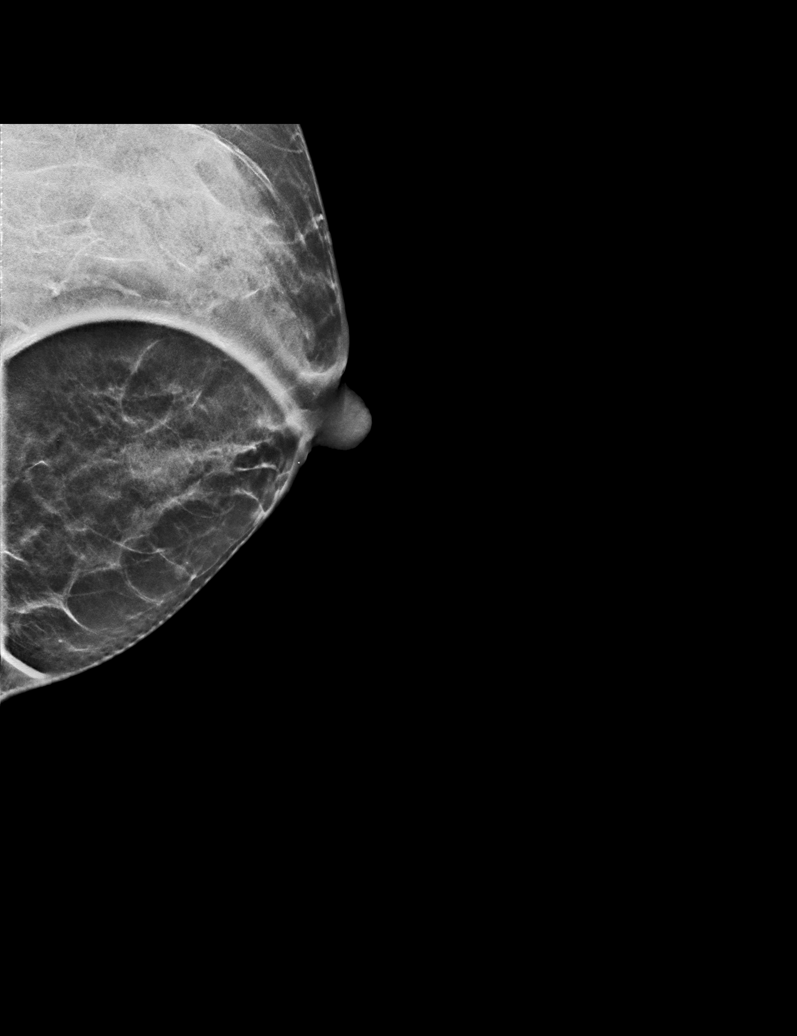

[L MLO tomo · tomo slice 21/40.0]
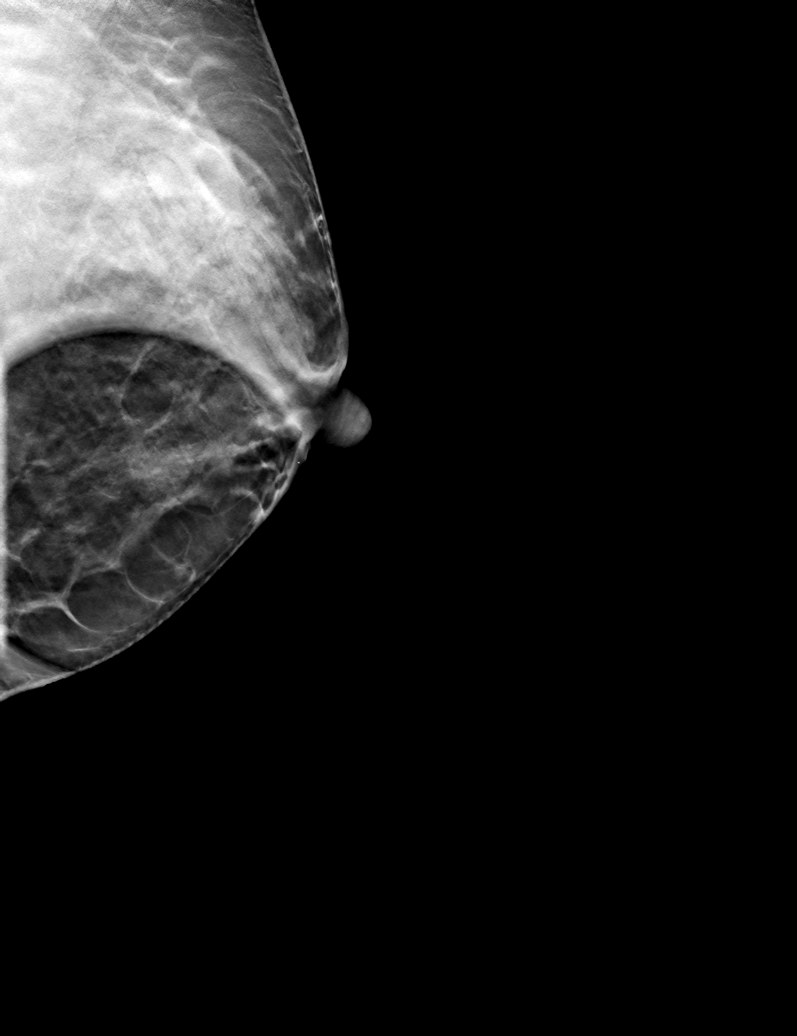

[L CC tomo · tomo slice 27/54.0]
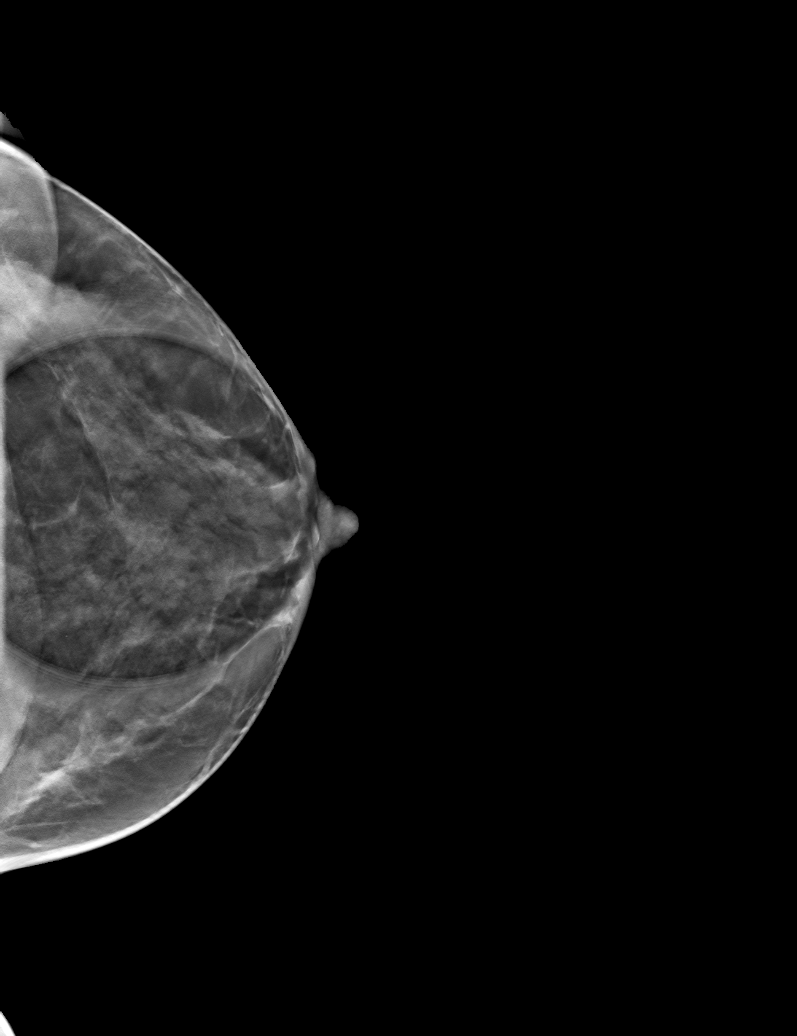

[6 of 14 positions shown; findings below may reference images not displayed]

ACR Breast Density Category c: The breast tissue is heterogeneously
dense, which may obscure small masses.
FINDINGS: Focal spot compression views with tomography of the lower central
left breast confirm a low-density circumscribed oval mass measuring
approximately 1.4 cm greatest diameter.

Mammographic images were processed with CAD.

On physical exam, there is a smooth, soft palpable lump in the 6
o'clock position of the left breast approximately 1 cm from the
nipple.

Targeted ultrasound is performed, showing a simple cyst measuring
1.4 x 0.9 x 1.3 cm at 6 o'clock position 1 cm from the nipple. This
corresponds in size shape position to the mammographically detected
mass.
IMPRESSION: 1.4 cm simple cyst at 6 o'clock position left breast. No evidence of
malignancy.

RECOMMENDATION:
Screening mammogram in one year.(Code:3C-7-73D)

I have discussed the findings and recommendations with the patient.
Results were also provided in writing at the conclusion of the
visit. If applicable, a reminder letter will be sent to the patient
regarding the next appointment.

BI-RADS CATEGORY  2: Benign.

## 2019-03-15 ENCOUNTER — Other Ambulatory Visit: Payer: Self-pay

## 2019-03-15 DIAGNOSIS — Z20822 Contact with and (suspected) exposure to covid-19: Secondary | ICD-10-CM

## 2019-03-16 LAB — NOVEL CORONAVIRUS, NAA: SARS-CoV-2, NAA: NOT DETECTED

## 2019-03-29 ENCOUNTER — Other Ambulatory Visit: Payer: Self-pay

## 2019-03-29 DIAGNOSIS — Z20822 Contact with and (suspected) exposure to covid-19: Secondary | ICD-10-CM

## 2019-04-01 LAB — NOVEL CORONAVIRUS, NAA: SARS-CoV-2, NAA: NOT DETECTED

## 2020-09-03 ENCOUNTER — Encounter: Payer: BLUE CROSS/BLUE SHIELD | Admitting: Vascular Surgery

## 2020-09-08 ENCOUNTER — Other Ambulatory Visit: Payer: Self-pay

## 2020-09-08 ENCOUNTER — Encounter: Payer: Self-pay | Admitting: Vascular Surgery

## 2020-09-08 ENCOUNTER — Ambulatory Visit (INDEPENDENT_AMBULATORY_CARE_PROVIDER_SITE_OTHER): Payer: Managed Care, Other (non HMO) | Admitting: Vascular Surgery

## 2020-09-08 VITALS — BP 121/75 | HR 67 | Temp 97.7°F | Resp 16 | Ht 69.0 in | Wt 154.0 lb

## 2020-09-08 DIAGNOSIS — Q2730 Arteriovenous malformation, site unspecified: Secondary | ICD-10-CM | POA: Diagnosis not present

## 2020-09-08 NOTE — Progress Notes (Signed)
[facility] [place]  ASSESSMENT / PLAN: 55 y.o. 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 This is small (1x2cm) and stable since childhood. She has a warm, well perfused hand. I counseled her that excision of the malformation would require open surgery, and we should only pursue this if she was interested. Before excising this I would ideally image this to get a sense of the extent of the resection required. She was not interested in resection, and just wanted to make sure it was not a threat to her. 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  I counseled her to return to care if the lesion appears to be worsening or becomes more bothersome to her.  Otherwise she can follow-up with me as needed.  CHIEF COMPLAINT: Venous lesion about the left elbow  HISTORY OF PRESENT ILLNESS: Lauren York is a 55 y.o. female who presents to clinic for evaluation of a cutaneous vascular lesion over the proximal aspect of the right volar elbow.  She reports this has been present since her childhood years.  She does not endorse any kind of trauma or procedure preceding the development of this lesion.  She first noticed it when she was about 55 years old.  It has been stable since that time.  She has never had any difficulty with the use of her right hand.  She is fit and exercises regularly.  She has no medical problems.  She is a non-smoker.  Past Medical History:  Diagnosis Date  . Heavy menstrual bleeding     Past Surgical History:  Procedure Laterality Date  . CESAREAN SECTION  2000  &  2006  . DILITATION & CURRETTAGE/HYSTROSCOPY WITH NOVASURE ABLATION N/A 01/20/2015   Procedure: DILATATION & CURETTAGE/HYSTEROSCOPY WITH NOVASURE ABLATION;  Surgeon: Mitchel Honour, DO;  Location: [facility];  Service:  Gynecology;  Laterality: N/A;  . LAPAROSCOPIC TUBAL LIGATION Bilateral 01/20/2015   Procedure: LAPAROSCOPIC TUBAL LIGATION WITH FISHIE CLIPS;  Surgeon: Mitchel Honour, DO;  Location: [facility] ;  Service: Gynecology;  Laterality: Bilateral;  . REMOVAL OF DRUG DELIVERY IMPLANT Left 01/20/2015   Procedure: REMOVAL OF DRUG DELIVERY IMPLANT (NEXPLANON REMOVAL FROM LEFT ARM);  Surgeon: Mitchel Honour, DO;  Location: [facility];  Service: Gynecology;  Laterality: Left;    Family History  Problem Relation Age of Onset  . Atrial fibrillation Mother   . Heart disease Father   . Cancer Father        prostate  . Heart disease Maternal Grandmother     Social History   Socioeconomic History  . Marital status: Married    Spouse name: Not on file  . Number of children: Not on file  . Years of education: Not on file  . Highest education level: Not on file  Occupational History  . Not on file  Tobacco Use  . Smoking status: Never Smoker  . Smokeless tobacco: Never Used  Substance and Sexual Activity  . Alcohol use: Not on file    Comment: weekly  . Drug use: No  . Sexual activity: Yes    Birth control/protection: Implant  Other Topics Concern  . Not on file  Social History Narrative  . Not on file   Social Determinants of Health   Financial Resource Strain: Not on file  Food Insecurity: Not on file  Transportation Needs: Not on file  Physical Activity: Not on file  Stress: Not on file  Social Connections: Not on file  Intimate Partner Violence: Not on file    No Known Allergies  Current Outpatient Medications  Medication Sig Dispense Refill  . ALPRAZolam (XANAX) 0.25 MG tablet Take 0.25 mg by mouth 3 (three) times daily as needed for anxiety.    Marland Kitchen ibuprofen (ADVIL,MOTRIN) 800 MG tablet Take 1 tablet (800 mg total) by mouth every 6 (six) hours as needed. 30 tablet 1  . oxyCODONE-acetaminophen (ROXICET) 5-325 MG per tablet Take 1-2 tablets by mouth  every 4 (four) hours as needed for severe pain. 20 tablet 0   No current facility-administered medications for this visit.    REVIEW OF SYSTEMS:  [X]  denotes positive finding, [ ]  denotes negative finding Cardiac  Comments:  Chest pain or chest pressure:    Shortness of breath upon exertion:    Short of breath when lying flat:    Irregular heart rhythm:        Vascular    Pain in calf, thigh, or hip brought on by ambulation:    Pain in feet at night that wakes you up from your sleep:     Blood clot in your veins:    Leg swelling:         Pulmonary    Oxygen at home:    Productive cough:     Wheezing:         Neurologic    Sudden weakness in arms or legs:     Sudden numbness in arms or legs:     Sudden onset of difficulty speaking or slurred speech:    Temporary loss of vision in one eye:     Problems with dizziness:         Gastrointestinal    Blood in stool:     Vomited blood:         Genitourinary    Burning when urinating:     Blood in urine:        Psychiatric    Major depression:         Hematologic    Bleeding problems:    Problems with blood clotting too easily:        Skin    Rashes or ulcers:        Constitutional    Fever or chills:      PHYSICAL EXAM  Vitals:   09/08/20 1424  BP: 121/75  Pulse: 67  Resp: 16  Temp: 97.7 F (36.5 C)  SpO2: 100%  Weight: 154 lb (69.9 kg)  Height: 5\' 9"  (1.753 m)    Constitutional: Healthy appearing younger than stated age. No distress.  Neurologic: CN intact. No focal findings. No sensory loss. Psychiatric: Mood and affect symmetric and appropriate. Eyes: No icterus. No conjunctival pallor. Ears, nose, throat: mucous membranes moist. Midline trachea.  Cardiac: regular rate and rhythm.  Respiratory: unlabored. Abdominal: soft, non-tender, non-distended.  Peripheral vascular:  2+ R brachial pulse  2+ R radial pulse  1x2 cm vascular subcutaneous mass. Mass does not exsanguinate with compression.   Extremity: No edema. No cyanosis. No pallor.  Skin: No gangrene. No ulceration.  Lymphatic: No Stemmer's sign. No palpable lymphadenopathy.  PERTINENT LABORATORY AND RADIOLOGIC DATA  Most recent CBC CBC Latest Ref Rng & Units 01/20/2015 07/24/2013  WBC 4.0 - 10.5 K/uL - 5.4  Hemoglobin 12.0 - 15.0 g/dL 13.2  Hematocrit 36.0 - 46.0 % - 38.0  Platelets 150 - 400 K/uL - 273    Most recent CMP CMP Latest Ref Rng & Units 07/24/2013  Glucose 70 - 99 mg/dL 88  BUN 6 - 23 mg/dL 11  Creatinine 2.20 - 2.54 mg/dL 2.70  Sodium 623 - 762 mEq/L 137  Potassium 3.5 - 5.3 mEq/L 4.4  Chloride 96 - 112 mEq/L 102  CO2 19 - 32 mEq/L 27  Calcium 8.4 - 10.5 mg/dL 9.7  Total Protein 6.0 - 8.3 g/dL 7.3  Total Bilirubin 0.2 - 1.2 mg/dL 0.9  Alkaline Phos 39 - 117 U/L 39  AST 0 - 37 U/L 15  ALT 0 - 35 U/L 10   LDL Cholesterol  Date Value Ref Range Status  07/24/2013 107 (H) 0 - 99 mg/dL Final    Comment:      Total Cholesterol/HDL Ratio:CHD Risk                        Coronary Heart Disease Risk Table                                        Men       Women          1/2 Average Risk              3.4        3.3              Average Risk              5.0        4.4           2X Average Risk              9.6        7.1           3X Average Risk             23.4       11.0 Use the calculated Patient Ratio above and the CHD Risk table  to determine the patient's CHD Risk. ATP III Classification (LDL):       < 100        mg/dL         Optimal      831 - 129     mg/dL         Near or Above Optimal      130 - 159     mg/dL         Borderline High      160 - 189     mg/dL         High       > 517        mg/dL         Very High       Bransyn Adami N. Lenell Antu, MD [facility] Phone Number: [phone] 09/08/2020 8:41 AM

## 2020-11-24 LAB — COLOGUARD: COLOGUARD: NEGATIVE

## 2022-09-19 ENCOUNTER — Other Ambulatory Visit: Payer: Self-pay | Admitting: Obstetrics & Gynecology

## 2022-09-19 DIAGNOSIS — R928 Other abnormal and inconclusive findings on diagnostic imaging of breast: Secondary | ICD-10-CM

## 2022-09-28 ENCOUNTER — Ambulatory Visit
Admission: RE | Admit: 2022-09-28 | Discharge: 2022-09-28 | Disposition: A | Discharge status: Home / Self Care | Payer: Managed Care, Other (non HMO) | Source: Ambulatory Visit | Attending: Obstetrics & Gynecology | Admitting: Obstetrics & Gynecology

## 2022-09-28 ENCOUNTER — Ambulatory Visit
Admission: RE | Admit: 2022-09-28 | Discharge: 2022-09-28 | Disposition: A | Discharge status: Home / Self Care | Payer: PRIVATE HEALTH INSURANCE | Source: Ambulatory Visit | Attending: Obstetrics & Gynecology | Admitting: Obstetrics & Gynecology

## 2022-09-28 DIAGNOSIS — R928 Other abnormal and inconclusive findings on diagnostic imaging of breast: Secondary | ICD-10-CM

## 2023-12-14 ENCOUNTER — Ambulatory Visit: Payer: PRIVATE HEALTH INSURANCE | Admitting: Sports Medicine

## 2023-12-21 ENCOUNTER — Ambulatory Visit (INDEPENDENT_AMBULATORY_CARE_PROVIDER_SITE_OTHER): Payer: PRIVATE HEALTH INSURANCE | Admitting: Family Medicine

## 2023-12-21 ENCOUNTER — Other Ambulatory Visit: Payer: Self-pay

## 2023-12-21 ENCOUNTER — Encounter: Payer: Self-pay | Admitting: Family Medicine

## 2023-12-21 VITALS — BP 120/78 | Ht 69.0 in | Wt 155.0 lb

## 2023-12-21 DIAGNOSIS — M7711 Lateral epicondylitis, right elbow: Secondary | ICD-10-CM

## 2023-12-21 DIAGNOSIS — M25521 Pain in right elbow: Secondary | ICD-10-CM

## 2023-12-21 MED ORDER — NITROGLYCERIN 0.2 MG/HR TD PT24: MEDICATED_PATCH | TRANSDERMAL | 1 refills | Status: AC

## 2023-12-21 NOTE — Patient Instructions (Signed)

## 2023-12-21 NOTE — Progress Notes (Addendum)
    SUBJECTIVE:   CHIEF COMPLAINT / HPI:   Sharp pain in right lateral elbow x 6 weeks.  No known mechanism of injury.  Patient is active and does use low weights for lifting.  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  She has pain with full elbow extension as well as with wrist dorsiflexion.  She has noticed some apparent decrease in strength related pain.  She has been using heat, ice and naproxen intermittently with some relief.  It often feels worse in the evening and with movement, although for the last 2 weeks movement has improved her pain somewhat.  PERTINENT  PMH / PSH: None  OBJECTIVE:   BP 120/78   Ht 5' 9 (1.753 m)   Wt 155 lb (70.3 kg)   BMI 22.89 kg/m   MSK: Mild tenderness to palpation over the lateral epicondyle of the right elbow, as well as the extensor muscle group.  No tenderness over the medial epicondyle or joint space.  No evidence of bruising or skin discoloration.  Full pain-free flexion at the elbow, elbow extension limited to 160 degrees due to pain.  Painful dorsiflexion of the wrist with resistance.  POC US  right lateral elbow: bone spur at the insertion point for the extensor tendon with small hypoechoic appearing patch representing probable tearing.  Some increased blood flow noted on Doppler.  ASSESSMENT/PLAN:   Assessment & Plan Lateral epicondylitis, right elbow - Tendinitis with minimal tearing of the extensor tendon of the right elbow as seen on MSK U/S -Begin nitroglycerin  patch therapy x 12 weeks -Home exercise regimen -Advised patient to avoid painful activities for the next 6 weeks -Follow-up in 6 weeks   Lucie Pinal, DO [facility]

## 2024-01-18 ENCOUNTER — Other Ambulatory Visit: Payer: Self-pay

## 2024-01-18 ENCOUNTER — Encounter: Payer: Self-pay | Admitting: Family Medicine

## 2024-01-18 ENCOUNTER — Ambulatory Visit (INDEPENDENT_AMBULATORY_CARE_PROVIDER_SITE_OTHER): Payer: PRIVATE HEALTH INSURANCE | Admitting: Family Medicine

## 2024-01-18 VITALS — BP 112/74 | Ht 69.0 in | Wt 155.0 lb

## 2024-01-18 DIAGNOSIS — M7711 Lateral epicondylitis, right elbow: Secondary | ICD-10-CM

## 2024-01-18 NOTE — Progress Notes (Cosign Needed)
 DATE OF VISIT: 01/18/2024        Lauren York DOB: 23-Jan-1966 MRN: [id number]  CC:  R elbow pain  History of present Illness: Lauren York is a 58 y.o. female who presents for a follow-up visit of R lateral epicondylitis.  Pain is about 30% improved.  No longer having sharp pains, feels stiffness when she is extending her arm.  Has been doing stretching exercises at home.  Medications:  Outpatient Encounter Medications as of 01/18/2024  Medication Sig   estradiol (CLIMARA - DOSED IN MG/24 HR) 0.05 mg/24hr patch estradiol 0.05 mg/24 hr weekly transdermal patch  APP 1 PA EXT TO THE SKIN WEEKLY UTD   nitroGLYCERIN  (NITRODUR - DOSED IN MG/24 HR) 0.2 mg/hr patch Use 1/4 patch daily to the affected area.   progesterone (PROMETRIUM) 200 MG capsule progesterone micronized 200 mg capsule  TK 1 C PO HS   No facility-administered encounter medications on file as of 01/18/2024.    Allergies: has no known allergies.  Physical Examination: Vitals: BP 112/74   Ht 5' 9 (1.753 m)   Wt 155 lb (70.3 kg)   BMI 22.89 kg/m  GENERAL:  Lauren York is a 58 y.o. female appearing their stated age, alert and oriented x 3, in no apparent distress.  SKIN: no rashes or lesions, skin clean, dry, intact MSK: Tenderness to palpation along R lateral epicondyle. Mild pain with resisted wrist and third finger extension. No pain with wrist or elbow flexion.  NEURO: sensation intact to light touch VASC: pulses 2+ and symmetric bilaterally  Radiology: Ultrasound R elbow Small bone spur 3mm of R lateral epicondyle. Partial tearing appears improved. Good doppler flow to area.   Assessment & Plan Lateral epicondylitis, right elbow Continue nitroglycerine protocol, tolerating well Incorporate strength exercises with stretching exercises at home Use elbow sleeve if needed Follow up in 6 weeks Discussed potential for shockwave therapy/PRP in the future   Patient expressed understanding & agreement with above.  Encounter  Diagnosis  Name Primary?   Lateral epicondylitis, right elbow Yes    Orders Placed This Encounter  Procedures   US  LIMITED JOINT SPACE STRUCTURES UP RIGHT   US  LIMITED JOINT SPACE STRUCTURES UP RIGHT

## 2024-03-11 ENCOUNTER — Ambulatory Visit: Admitting: Family Medicine
